# Patient Record
Sex: Female | Born: 1955 | Race: White | Hispanic: No | Marital: Married | Smoking: Never smoker
Health system: Southern US, Community
[De-identification: ages and names within clinical notes are randomized; demographics above are authoritative.]

## PROBLEM LIST (undated history)

## (undated) DIAGNOSIS — E7800 Pure hypercholesterolemia, unspecified: Secondary | ICD-10-CM

## (undated) HISTORY — PX: RETINAL DETACHMENT SURGERY: SHX105

## (undated) HISTORY — PX: OTHER SURGICAL HISTORY: SHX169

## (undated) HISTORY — PX: EYE SURGERY: SHX253

---

## 2004-03-24 ENCOUNTER — Ambulatory Visit: Payer: Self-pay | Admitting: Internal Medicine

## 2004-07-29 ENCOUNTER — Emergency Department: Payer: Self-pay | Admitting: General Practice

## 2004-08-20 ENCOUNTER — Ambulatory Visit: Payer: Self-pay | Admitting: Podiatry

## 2005-03-21 ENCOUNTER — Ambulatory Visit: Payer: Self-pay | Admitting: Internal Medicine

## 2006-03-27 ENCOUNTER — Ambulatory Visit: Payer: Self-pay | Admitting: Internal Medicine

## 2007-04-03 ENCOUNTER — Ambulatory Visit: Payer: Self-pay | Admitting: Internal Medicine

## 2008-04-23 ENCOUNTER — Ambulatory Visit: Payer: Self-pay | Admitting: Internal Medicine

## 2008-06-06 HISTORY — PX: BREAST CYST ASPIRATION: SHX578

## 2009-05-05 ENCOUNTER — Ambulatory Visit: Payer: Self-pay | Admitting: Internal Medicine

## 2009-05-12 ENCOUNTER — Ambulatory Visit: Payer: Self-pay | Admitting: Internal Medicine

## 2009-05-20 ENCOUNTER — Ambulatory Visit: Payer: Self-pay | Admitting: General Surgery

## 2009-06-06 HISTORY — PX: BREAST CYST ASPIRATION: SHX578

## 2009-10-14 ENCOUNTER — Ambulatory Visit: Payer: Self-pay | Admitting: General Surgery

## 2010-05-06 ENCOUNTER — Ambulatory Visit: Payer: Self-pay | Admitting: Internal Medicine

## 2010-05-11 ENCOUNTER — Ambulatory Visit: Payer: Self-pay | Admitting: Gastroenterology

## 2010-05-13 ENCOUNTER — Ambulatory Visit: Payer: Self-pay | Admitting: Internal Medicine

## 2010-11-16 ENCOUNTER — Ambulatory Visit: Payer: Self-pay | Admitting: General Surgery

## 2011-09-13 ENCOUNTER — Ambulatory Visit: Payer: Self-pay | Admitting: Internal Medicine

## 2012-09-18 ENCOUNTER — Ambulatory Visit: Payer: Self-pay

## 2013-11-26 ENCOUNTER — Ambulatory Visit: Payer: Self-pay

## 2013-12-10 ENCOUNTER — Ambulatory Visit: Payer: Self-pay

## 2014-11-18 ENCOUNTER — Other Ambulatory Visit: Payer: Self-pay | Admitting: Internal Medicine

## 2014-11-18 DIAGNOSIS — Z1231 Encounter for screening mammogram for malignant neoplasm of breast: Secondary | ICD-10-CM

## 2014-12-30 ENCOUNTER — Ambulatory Visit
Admission: RE | Admit: 2014-12-30 | Discharge: 2014-12-30 | Disposition: A | Discharge status: Home / Self Care | Payer: 59 | Source: Ambulatory Visit | Attending: Internal Medicine | Admitting: Internal Medicine

## 2014-12-30 DIAGNOSIS — Z1231 Encounter for screening mammogram for malignant neoplasm of breast: Secondary | ICD-10-CM

## 2015-02-13 ENCOUNTER — Encounter: Payer: Self-pay | Admitting: *Deleted

## 2015-02-16 ENCOUNTER — Ambulatory Visit: Payer: 59 | Admitting: Anesthesiology

## 2015-02-16 ENCOUNTER — Ambulatory Visit
Admission: RE | Admit: 2015-02-16 | Discharge: 2015-02-16 | Disposition: A | Discharge status: Home / Self Care | Payer: 59 | Source: Ambulatory Visit | Attending: Gastroenterology | Admitting: Gastroenterology

## 2015-02-16 ENCOUNTER — Encounter
Admission: RE | Disposition: A | Discharge status: Home / Self Care | Payer: Self-pay | Source: Ambulatory Visit | Attending: Gastroenterology

## 2015-02-16 DIAGNOSIS — E780 Pure hypercholesterolemia: Secondary | ICD-10-CM | POA: Diagnosis not present

## 2015-02-16 DIAGNOSIS — Z800 Family history of malignant neoplasm of digestive organs: Secondary | ICD-10-CM | POA: Insufficient documentation

## 2015-02-16 DIAGNOSIS — Z79899 Other long term (current) drug therapy: Secondary | ICD-10-CM | POA: Insufficient documentation

## 2015-02-16 DIAGNOSIS — Z1211 Encounter for screening for malignant neoplasm of colon: Secondary | ICD-10-CM | POA: Insufficient documentation

## 2015-02-16 HISTORY — DX: Pure hypercholesterolemia, unspecified: E78.00

## 2015-02-16 HISTORY — PX: COLONOSCOPY WITH PROPOFOL: SHX5780

## 2015-02-16 SURGERY — COLONOSCOPY WITH PROPOFOL
Anesthesia: General

## 2015-02-16 MED ORDER — PROPOFOL INFUSION 10 MG/ML OPTIME
INTRAVENOUS | Status: DC | PRN
  Administered 2015-02-16: 120 ug/kg/min via INTRAVENOUS

## 2015-02-16 MED ORDER — PROPOFOL 10 MG/ML IV BOLUS
INTRAVENOUS | Status: DC | PRN
  Administered 2015-02-16: 50 mg via INTRAVENOUS

## 2015-02-16 MED ORDER — MIDAZOLAM HCL 5 MG/5ML IJ SOLN
INTRAMUSCULAR | Status: DC | PRN
  Administered 2015-02-16: 1 mg via INTRAVENOUS

## 2015-02-16 MED ORDER — FENTANYL CITRATE (PF) 100 MCG/2ML IJ SOLN
INTRAMUSCULAR | Status: DC | PRN
  Administered 2015-02-16: 50 ug via INTRAVENOUS

## 2015-02-16 MED ORDER — SODIUM CHLORIDE 0.9 % IV SOLN
INTRAVENOUS | Status: DC
  Administered 2015-02-16: 1000 mL via INTRAVENOUS
  Administered 2015-02-16: 10:00:00 via INTRAVENOUS

## 2015-02-16 MED ORDER — SODIUM CHLORIDE 0.9 % IV SOLN: INTRAVENOUS | Status: DC

## 2015-02-16 NOTE — Anesthesia Postprocedure Evaluation (Signed)
  Anesthesia Post-op Note  Patient: Dominique Herrera  Procedure(s) Performed: Procedure(s): COLONOSCOPY WITH PROPOFOL (N/A)  Anesthesia type:General  Patient location: PACU  Post pain: Pain level controlled  Post assessment: Post-op Vital signs reviewed, Patient's Cardiovascular Status Stable, Respiratory Function Stable, Patent Airway and No signs of Nausea or vomiting  Post vital signs: Reviewed and stable  Last Vitals:  Filed Vitals:   02/16/15 1026  BP: 86/56  Pulse: 59  Temp: 36.3 C  Resp: 14    Level of consciousness: awake, alert  and patient cooperative  Complications: No apparent anesthesia complications

## 2015-02-16 NOTE — Transfer of Care (Signed)
Immediate Anesthesia Transfer of Care Note  Patient: Dominique Herrera  Procedure(s) Performed: Procedure(s): COLONOSCOPY WITH PROPOFOL (N/A)  Patient Location: PACU  Anesthesia Type:General  Level of Consciousness: sedated  Airway & Oxygen Therapy: Patient Spontanous Breathing  Post-op Assessment: Report given to RN  Post vital signs: stable  Last Vitals:  Filed Vitals:   02/16/15 0952  BP: 116/66  Pulse: 69  Temp: 36.9 C  Resp: 18    Complications: No apparent anesthesia complications

## 2015-02-16 NOTE — H&P (Signed)
    Primary Care Physician:  Leotis Shames, MD Primary Gastroenterologist:  Dr. Bluford Kaufmann  Pre-Procedure History & Physical: HPI:  Dominique Herrera is a 59 y.o. female is here for an colonoscopy.   Past Medical History  Diagnosis Date  . Elevated cholesterol     Past Surgical History  Procedure Laterality Date  . Breast cyst aspiration Left 2010  . Breast cyst aspiration Right 2011  . Eye surgery    . Retinal detachment surgery    . Excision of foot lesion cyst ganglion    . Cesarean section    . Aspiration of breast cysts  Bilateral     Prior to Admission medications   Medication Sig Start Date End Date Taking? Authorizing Provider  calcium-vitamin D (OSCAL WITH D) 500-200 MG-UNIT per tablet Take 1 tablet by mouth.   Yes Historical Provider, MD  niacin 500 MG tablet Take 500 mg by mouth at bedtime.   Yes Historical Provider, MD    Allergies as of 01/04/2015  . (No Known Allergies)    Family History  Problem Relation Age of Onset  . Colon cancer Maternal Grandfather   . Colon cancer Brother     Social History   Social History  . Marital Status: Married    Spouse Name: N/A  . Number of Children: N/A  . Years of Education: N/A   Occupational History  . Not on file.   Social History Main Topics  . Smoking status: Never Smoker   . Smokeless tobacco: Never Used  . Alcohol Use: Not on file  . Drug Use: Not on file  . Sexual Activity: Not on file   Other Topics Concern  . Not on file   Social History Narrative    Review of Systems: See HPI, otherwise negative ROS  Physical Exam: BP 116/66 mmHg  Pulse 69  Temp(Src) 98.4 F (36.9 C) (Tympanic)  Resp 18  Ht  (1.651 m)  Wt 70.308 kg (155 lb)  BMI 25.79 kg/m2  SpO2 97% General:   Alert,  pleasant and cooperative in NAD Head:  Normocephalic and atraumatic. Neck:  Supple; no masses or thyromegaly. Lungs:  Clear throughout to auscultation.    Heart:  Regular rate and rhythm. Abdomen:  Soft, nontender and  nondistended. Normal bowel sounds, without guarding, and without rebound.   Neurologic:  Alert and  oriented x4;  grossly normal neurologically.  Impression/Plan: [name] is here for an colonoscopy to be performed for screening, family hx of colon cancer and polyps.  Risks, benefits, limitations, and alternatives regarding  Colonoscopy have been reviewed with the patient.  Questions have been answered.  All parties agreeable.   Jennamarie Goings, Ezzard Standing, MD  02/16/2015, 9:57 AM

## 2015-02-16 NOTE — Anesthesia Preprocedure Evaluation (Signed)
Anesthesia Evaluation  Patient identified by MRN, date of birth, ID band Patient awake    Reviewed: Allergy & Precautions, NPO status , Patient's Chart, lab work & pertinent test results  History of Anesthesia Complications Negative for: history of anesthetic complications  Airway Mallampati: II   Neck ROM: Full    Dental  (+) Teeth Intact   Pulmonary neg pulmonary ROS,           Cardiovascular negative cardio ROS       Neuro/Psych negative neurological ROS     GI/Hepatic negative GI ROS, Neg liver ROS,   Endo/Other  negative endocrine ROS  Renal/GU negative Renal ROS     Musculoskeletal   Abdominal   Peds  Hematology negative hematology ROS (+)   Anesthesia Other Findings   Reproductive/Obstetrics                             Anesthesia Physical Anesthesia Plan  ASA: I  Anesthesia Plan: General   Post-op Pain Management:    Induction: Intravenous  Airway Management Planned: Nasal Cannula  Additional Equipment:   Intra-op Plan:   Post-operative Plan:   Informed Consent: I have reviewed the patients History and Physical, chart, labs and discussed the procedure including the risks, benefits and alternatives for the proposed anesthesia with the patient or authorized representative who has indicated his/her understanding and acceptance.     Plan Discussed with:   Anesthesia Plan Comments:         Anesthesia Quick Evaluation

## 2015-02-16 NOTE — Op Note (Signed)
[facility] Gastroenterology Patient Name: Dominique Herrera Procedure Date: 02/16/2015 10:07 AM MRN: [id number] Account #: [id number] Date of Birth: 1955/10/19 Admit Type: Outpatient Age: 59 Room: [facility] ENDO ROOM 4 Gender: Female Note Status: Finalized Procedure:         Colonoscopy Indications:       Colon cancer screening in patient at increased risk:                     Family history of colon polyps, Screening in patient at                     increased risk: Family history of 1st-degree relative with                     colorectal cancer before age 1 years Providers:         Ezzard Standing. Bluford Kaufmann, MD Referring MD:      Leotis Shames (Referring MD) Medicines:         Monitored Anesthesia Care Complications:     No immediate complications. Procedure:         Pre-Anesthesia Assessment:                    - Prior to the procedure, a History and Physical was                     performed, and patient medications, allergies and                     sensitivities were reviewed. The patient's tolerance of                     previous anesthesia was reviewed.                    - The risks and benefits of the procedure and the sedation                     options and risks were discussed with the patient. All                     questions were answered and informed consent was obtained.                    - After reviewing the risks and benefits, the patient was                     deemed in satisfactory condition to undergo the procedure.                    After obtaining informed consent, the colonoscope was                     passed under direct vision. Throughout the procedure, the                     patient's blood pressure, pulse, and oxygen saturations                     were monitored continuously. The Colonoscope was                     introduced through the anus and advanced to the the cecum,  identified by appendiceal orifice and ileocecal valve. The                    colonoscopy was performed without difficulty. The patient                     tolerated the procedure well. The quality of the bowel                     preparation was good. Findings:      The colon (entire examined portion) appeared normal. Impression:        - The entire examined colon is normal.                    - No specimens collected. Recommendation:    - Discharge patient to home.                    - Repeat colonoscopy in 5 years for surveillance.                    - The findings and recommendations were discussed with the                     patient. Procedure Code(s): --- Professional ---                    [phone], Colonoscopy, flexible; diagnostic, including                     collection of specimen(s) by brushing or washing, when                     performed (separate procedure) Diagnosis Code(s): --- Professional ---                    Z83.71, Family history of colonic polyps                    Z80.0, Family history of malignant neoplasm of digestive                     organs CPT copyright 2014 [employer]. All rights reserved. The codes documented in this report are preliminary and upon coder review may  be revised to meet current compliance requirements. Wallace Cullens, MD 02/16/2015 10:20:59 AM This report has been signed electronically. Number of Addenda: 0 Note Initiated On: 02/16/2015 10:07 AM Scope Withdrawal Time: 0 hours 3 minutes 5 seconds  Total Procedure Duration: 0 hours 7 minutes 15 seconds       [facility]

## 2015-02-16 NOTE — Transfer of Care (Signed)
Immediate Anesthesia Transfer of Care Note  Patient: Dominique Herrera  Procedure(s) Performed: Procedure(s): COLONOSCOPY WITH PROPOFOL (N/A)  Patient Location: PACU  Anesthesia Type:General  Level of Consciousness: sedated  Airway & Oxygen Therapy: Patient Spontanous Breathing  Post-op Assessment: Report given to RN  Post vital signs: stable  Last Vitals:  Filed Vitals:   02/16/15 1026  BP: 86/56  Pulse: 59  Temp: 36.3 C  Resp: 14    Complications: No apparent anesthesia complications

## 2015-02-17 ENCOUNTER — Encounter: Payer: Self-pay | Admitting: Gastroenterology

## 2015-10-07 ENCOUNTER — Other Ambulatory Visit: Payer: Self-pay | Admitting: Internal Medicine

## 2015-10-07 DIAGNOSIS — Z1231 Encounter for screening mammogram for malignant neoplasm of breast: Secondary | ICD-10-CM

## 2015-10-16 ENCOUNTER — Ambulatory Visit: Payer: 59

## 2016-01-05 ENCOUNTER — Ambulatory Visit
Admission: RE | Admit: 2016-01-05 | Discharge: 2016-01-05 | Disposition: A | Discharge status: Home / Self Care | Payer: 59 | Source: Ambulatory Visit | Attending: Internal Medicine | Admitting: Internal Medicine

## 2016-01-05 ENCOUNTER — Other Ambulatory Visit: Payer: Self-pay | Admitting: Internal Medicine

## 2016-01-05 DIAGNOSIS — Z1231 Encounter for screening mammogram for malignant neoplasm of breast: Secondary | ICD-10-CM | POA: Insufficient documentation

## 2016-11-15 ENCOUNTER — Other Ambulatory Visit: Payer: Self-pay | Admitting: Internal Medicine

## 2016-11-15 DIAGNOSIS — Z1239 Encounter for other screening for malignant neoplasm of breast: Secondary | ICD-10-CM

## 2017-03-20 ENCOUNTER — Ambulatory Visit
Admission: RE | Admit: 2017-03-20 | Discharge: 2017-03-20 | Disposition: A | Discharge status: Home / Self Care | Payer: 59 | Source: Ambulatory Visit | Attending: Internal Medicine | Admitting: Internal Medicine

## 2017-03-20 DIAGNOSIS — Z1231 Encounter for screening mammogram for malignant neoplasm of breast: Secondary | ICD-10-CM | POA: Diagnosis not present

## 2017-03-20 DIAGNOSIS — Z1239 Encounter for other screening for malignant neoplasm of breast: Secondary | ICD-10-CM

## 2017-03-22 ENCOUNTER — Other Ambulatory Visit: Payer: Self-pay | Admitting: Internal Medicine

## 2017-03-22 DIAGNOSIS — R928 Other abnormal and inconclusive findings on diagnostic imaging of breast: Secondary | ICD-10-CM

## 2017-03-30 ENCOUNTER — Ambulatory Visit
Admission: RE | Admit: 2017-03-30 | Discharge: 2017-03-30 | Disposition: A | Discharge status: Home / Self Care | Payer: 59 | Source: Ambulatory Visit | Attending: Internal Medicine | Admitting: Internal Medicine

## 2017-03-30 DIAGNOSIS — R928 Other abnormal and inconclusive findings on diagnostic imaging of breast: Secondary | ICD-10-CM

## 2017-03-30 DIAGNOSIS — N6489 Other specified disorders of breast: Secondary | ICD-10-CM | POA: Diagnosis present

## 2017-09-07 ENCOUNTER — Other Ambulatory Visit: Payer: Self-pay | Admitting: Internal Medicine

## 2017-09-07 DIAGNOSIS — R928 Other abnormal and inconclusive findings on diagnostic imaging of breast: Secondary | ICD-10-CM

## 2017-10-04 ENCOUNTER — Other Ambulatory Visit: Payer: Self-pay | Admitting: Internal Medicine

## 2017-10-04 DIAGNOSIS — R928 Other abnormal and inconclusive findings on diagnostic imaging of breast: Secondary | ICD-10-CM

## 2017-10-06 ENCOUNTER — Ambulatory Visit
Admission: RE | Admit: 2017-10-06 | Discharge: 2017-10-06 | Disposition: A | Discharge status: Home / Self Care | Payer: 59 | Source: Ambulatory Visit | Attending: Internal Medicine | Admitting: Internal Medicine

## 2017-10-06 ENCOUNTER — Ambulatory Visit: Payer: 59

## 2017-10-06 DIAGNOSIS — R928 Other abnormal and inconclusive findings on diagnostic imaging of breast: Secondary | ICD-10-CM

## 2018-06-15 ENCOUNTER — Other Ambulatory Visit: Payer: Self-pay | Admitting: Internal Medicine

## 2018-06-15 DIAGNOSIS — R928 Other abnormal and inconclusive findings on diagnostic imaging of breast: Secondary | ICD-10-CM

## 2018-07-03 ENCOUNTER — Other Ambulatory Visit: Payer: Self-pay | Admitting: Internal Medicine

## 2018-07-03 ENCOUNTER — Ambulatory Visit
Admission: RE | Admit: 2018-07-03 | Discharge: 2018-07-03 | Disposition: A | Discharge status: Home / Self Care | Payer: Medicare Other | Source: Ambulatory Visit | Attending: Internal Medicine | Admitting: Internal Medicine

## 2018-07-03 DIAGNOSIS — R928 Other abnormal and inconclusive findings on diagnostic imaging of breast: Secondary | ICD-10-CM

## 2019-01-21 ENCOUNTER — Other Ambulatory Visit: Payer: Self-pay | Admitting: Internal Medicine

## 2019-01-21 DIAGNOSIS — R928 Other abnormal and inconclusive findings on diagnostic imaging of breast: Secondary | ICD-10-CM

## 2019-03-08 ENCOUNTER — Ambulatory Visit
Admission: RE | Admit: 2019-03-08 | Discharge: 2019-03-08 | Disposition: A | Discharge status: Home / Self Care | Payer: Medicare Other | Source: Ambulatory Visit | Attending: Internal Medicine | Admitting: Internal Medicine

## 2019-03-08 DIAGNOSIS — R928 Other abnormal and inconclusive findings on diagnostic imaging of breast: Secondary | ICD-10-CM

## 2020-04-01 ENCOUNTER — Other Ambulatory Visit: Payer: Self-pay | Admitting: Internal Medicine

## 2020-04-01 DIAGNOSIS — Z1231 Encounter for screening mammogram for malignant neoplasm of breast: Secondary | ICD-10-CM

## 2020-05-08 ENCOUNTER — Ambulatory Visit
Admission: RE | Admit: 2020-05-08 | Discharge: 2020-05-08 | Disposition: A | Discharge status: Home / Self Care | Payer: Medicare Other | Source: Ambulatory Visit | Attending: Internal Medicine | Admitting: Internal Medicine

## 2020-05-08 ENCOUNTER — Other Ambulatory Visit: Payer: Self-pay

## 2020-05-08 DIAGNOSIS — Z1231 Encounter for screening mammogram for malignant neoplasm of breast: Secondary | ICD-10-CM | POA: Diagnosis not present

## 2020-05-13 ENCOUNTER — Other Ambulatory Visit: Payer: Self-pay | Admitting: Internal Medicine

## 2020-05-13 DIAGNOSIS — R928 Other abnormal and inconclusive findings on diagnostic imaging of breast: Secondary | ICD-10-CM

## 2020-05-26 ENCOUNTER — Other Ambulatory Visit: Payer: Self-pay

## 2020-05-26 ENCOUNTER — Ambulatory Visit
Admission: RE | Admit: 2020-05-26 | Discharge: 2020-05-26 | Disposition: A | Discharge status: Home / Self Care | Payer: Medicare Other | Source: Ambulatory Visit | Attending: Internal Medicine | Admitting: Internal Medicine

## 2020-05-26 DIAGNOSIS — R928 Other abnormal and inconclusive findings on diagnostic imaging of breast: Secondary | ICD-10-CM

## 2021-07-12 ENCOUNTER — Other Ambulatory Visit: Payer: Self-pay | Admitting: Internal Medicine

## 2021-07-12 DIAGNOSIS — Z1231 Encounter for screening mammogram for malignant neoplasm of breast: Secondary | ICD-10-CM

## 2021-07-20 ENCOUNTER — Other Ambulatory Visit: Payer: Self-pay

## 2021-07-20 ENCOUNTER — Ambulatory Visit
Admission: RE | Admit: 2021-07-20 | Discharge: 2021-07-20 | Disposition: A | Discharge status: Home / Self Care | Payer: Medicare Other | Source: Ambulatory Visit | Attending: Internal Medicine | Admitting: Internal Medicine

## 2021-07-20 DIAGNOSIS — Z1231 Encounter for screening mammogram for malignant neoplasm of breast: Secondary | ICD-10-CM | POA: Diagnosis present

## 2021-07-23 ENCOUNTER — Other Ambulatory Visit: Payer: Self-pay | Admitting: Internal Medicine

## 2021-07-23 DIAGNOSIS — R928 Other abnormal and inconclusive findings on diagnostic imaging of breast: Secondary | ICD-10-CM

## 2021-08-04 ENCOUNTER — Ambulatory Visit
Admission: RE | Admit: 2021-08-04 | Discharge: 2021-08-04 | Disposition: A | Discharge status: Home / Self Care | Payer: Medicare Other | Source: Ambulatory Visit | Attending: Internal Medicine | Admitting: Internal Medicine

## 2021-08-04 ENCOUNTER — Other Ambulatory Visit: Payer: Self-pay

## 2021-08-04 DIAGNOSIS — R928 Other abnormal and inconclusive findings on diagnostic imaging of breast: Secondary | ICD-10-CM

## 2021-08-06 ENCOUNTER — Other Ambulatory Visit: Payer: Self-pay | Admitting: Internal Medicine

## 2021-08-06 DIAGNOSIS — N6489 Other specified disorders of breast: Secondary | ICD-10-CM

## 2021-08-06 DIAGNOSIS — R928 Other abnormal and inconclusive findings on diagnostic imaging of breast: Secondary | ICD-10-CM

## 2021-08-18 ENCOUNTER — Ambulatory Visit
Admission: RE | Admit: 2021-08-18 | Discharge: 2021-08-18 | Disposition: A | Discharge status: Home / Self Care | Payer: Medicare Other | Source: Ambulatory Visit | Attending: Internal Medicine | Admitting: Internal Medicine

## 2021-08-18 ENCOUNTER — Other Ambulatory Visit: Payer: Self-pay

## 2021-08-18 DIAGNOSIS — N6489 Other specified disorders of breast: Secondary | ICD-10-CM | POA: Insufficient documentation

## 2021-08-18 DIAGNOSIS — R928 Other abnormal and inconclusive findings on diagnostic imaging of breast: Secondary | ICD-10-CM | POA: Insufficient documentation

## 2021-08-18 DIAGNOSIS — Z1231 Encounter for screening mammogram for malignant neoplasm of breast: Secondary | ICD-10-CM | POA: Insufficient documentation

## 2021-08-19 LAB — SURGICAL PATHOLOGY

## 2021-12-29 ENCOUNTER — Other Ambulatory Visit: Payer: Self-pay | Admitting: Internal Medicine

## 2021-12-29 DIAGNOSIS — N6489 Other specified disorders of breast: Secondary | ICD-10-CM

## 2022-02-23 ENCOUNTER — Ambulatory Visit
Admission: RE | Admit: 2022-02-23 | Discharge: 2022-02-23 | Disposition: A | Discharge status: Home / Self Care | Payer: Medicare Other | Source: Ambulatory Visit | Attending: Internal Medicine | Admitting: Internal Medicine

## 2022-02-23 DIAGNOSIS — N6489 Other specified disorders of breast: Secondary | ICD-10-CM

## 2023-03-11 IMAGING — US US BREAST*R* LIMITED INC AXILLA
2 series · 9 of 9 positions shown · non-contrast
Comparison: Prior films

CLINICAL DATA: Callback from screening mammogram for possible
asymmetries bilateral breasts.

EXAM:
DIGITAL DIAGNOSTIC BILATERAL MAMMOGRAM WITH TOMOSYNTHESIS AND CAD;
ULTRASOUND RIGHT BREAST LIMITED
TECHNIQUE: Bilateral digital diagnostic mammography and breast tomosynthesis
was performed. The images were evaluated with computer-aided
detection.; Targeted ultrasound examination of the right breast was
performed

[Series 1: us breast*right* limited inc axilla · 0.06mm/px · 7 of 7 slices shown (1 of 2)]
[im 1/7]
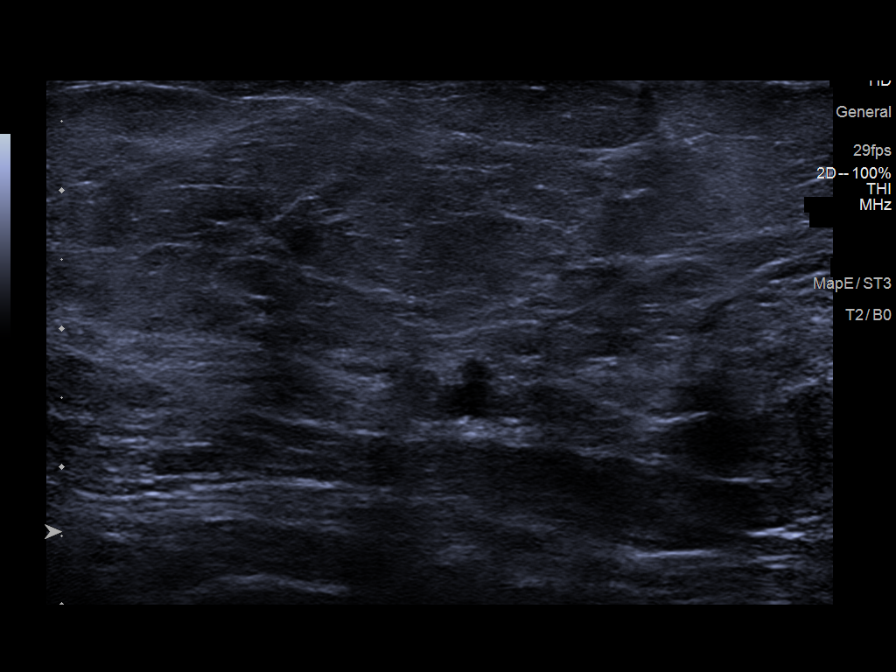
[im 2/7]
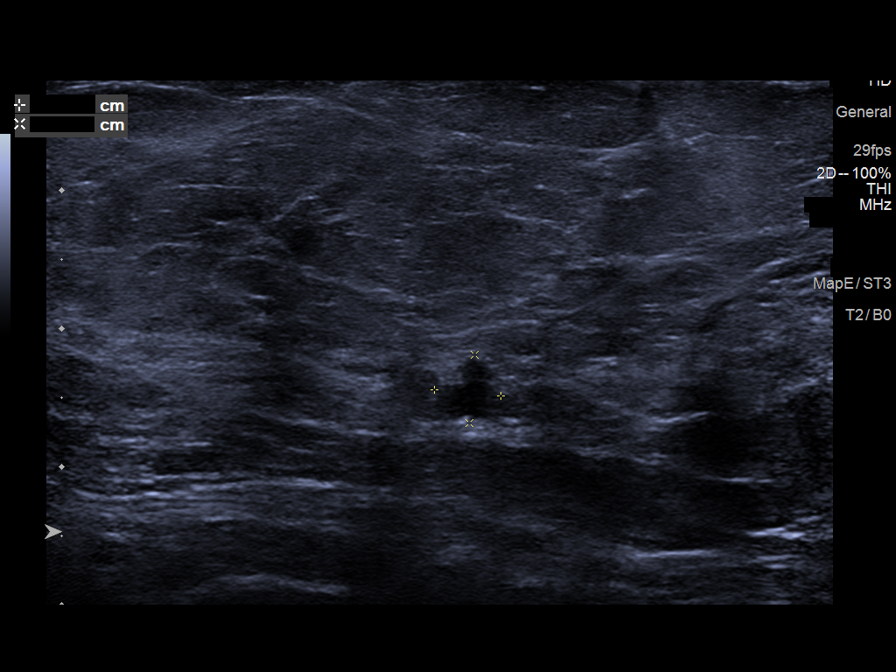
[im 3/7]
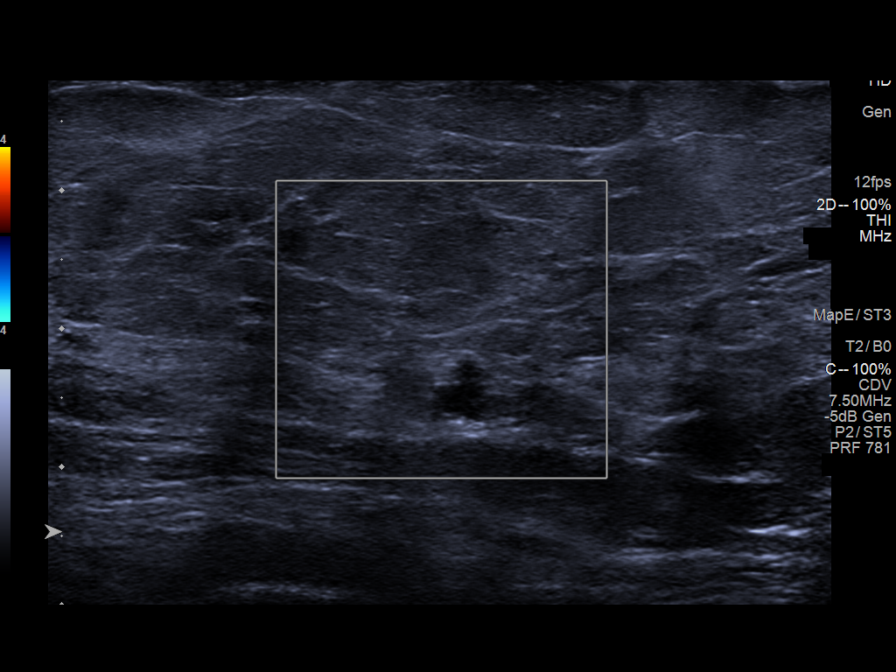
[im 4/7]
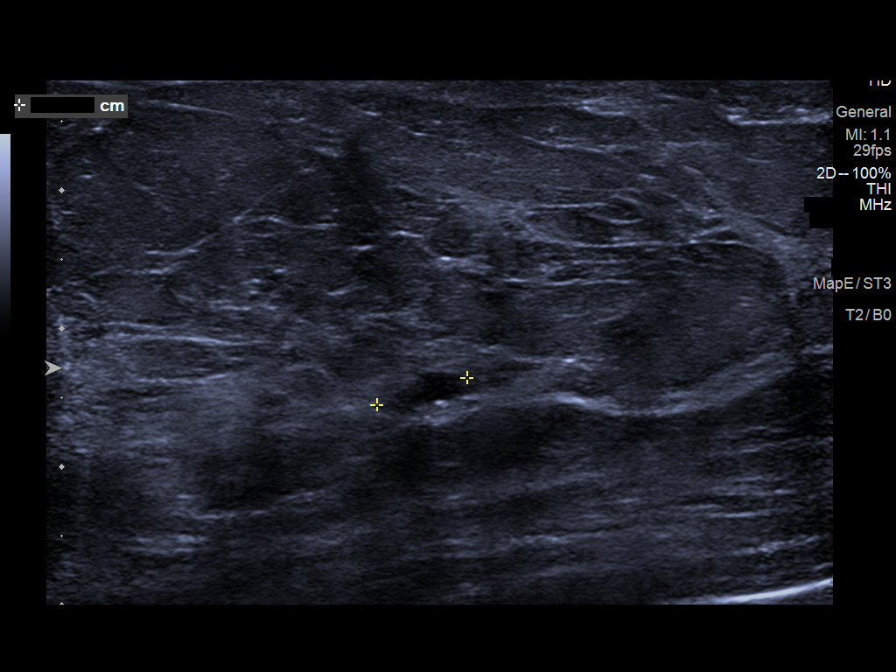
[im 5/7]
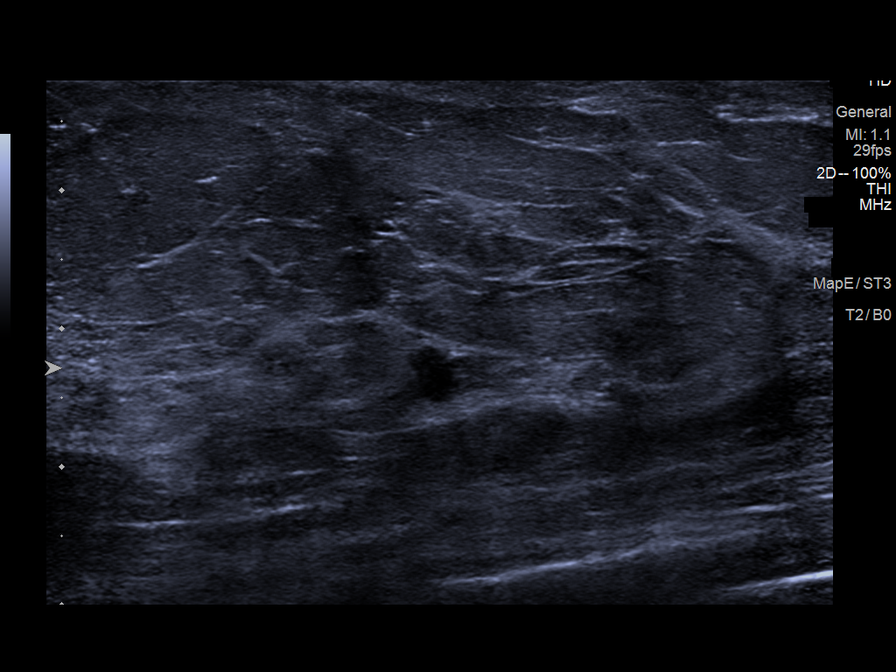
[im 6/7]
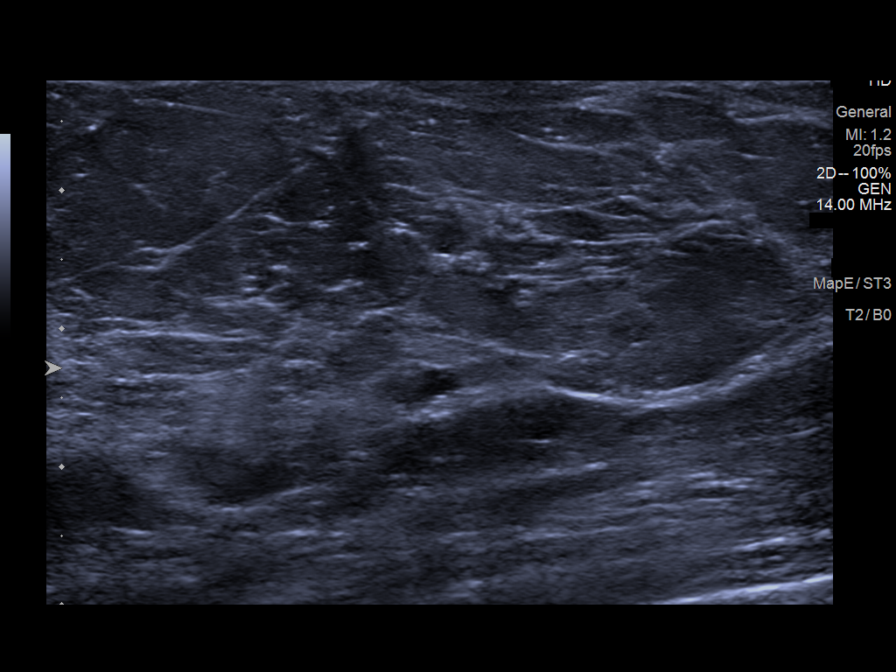
[im 7/7]
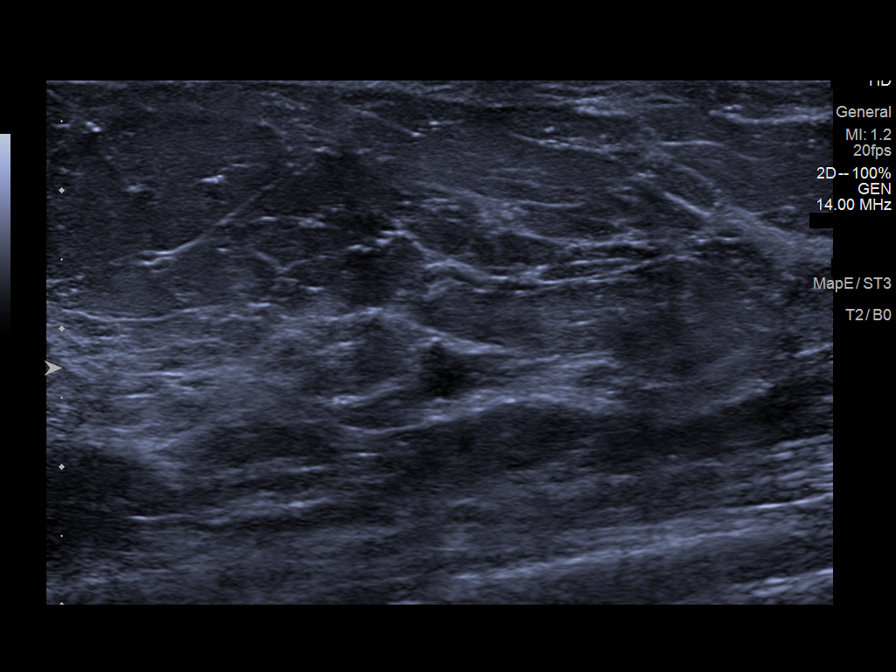

[Series 2: us breast*right* limited inc axilla · 0.09mm/px · 2 of 2 slices shown (2 of 2)]
[im 1/2]
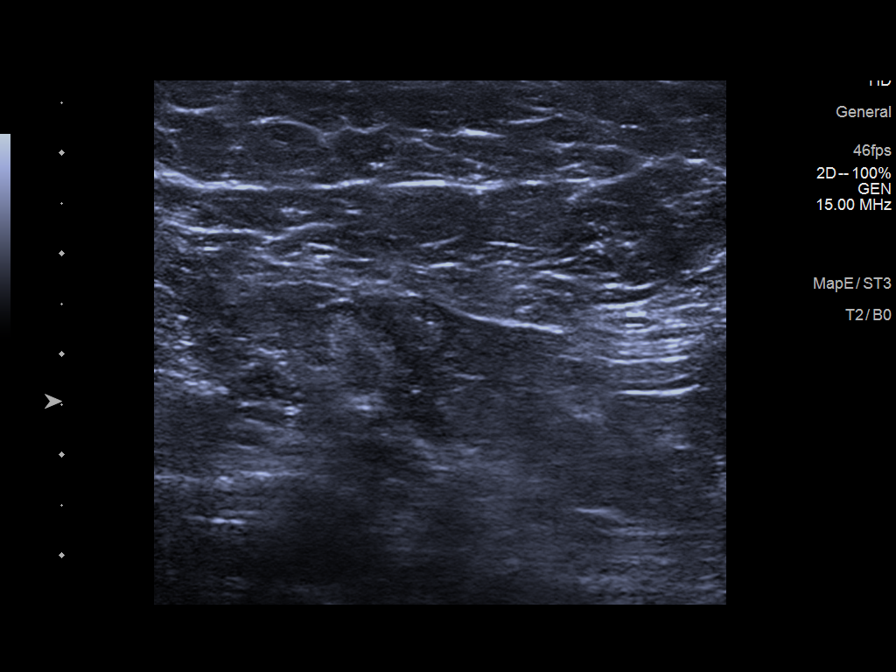
[im 2/2]
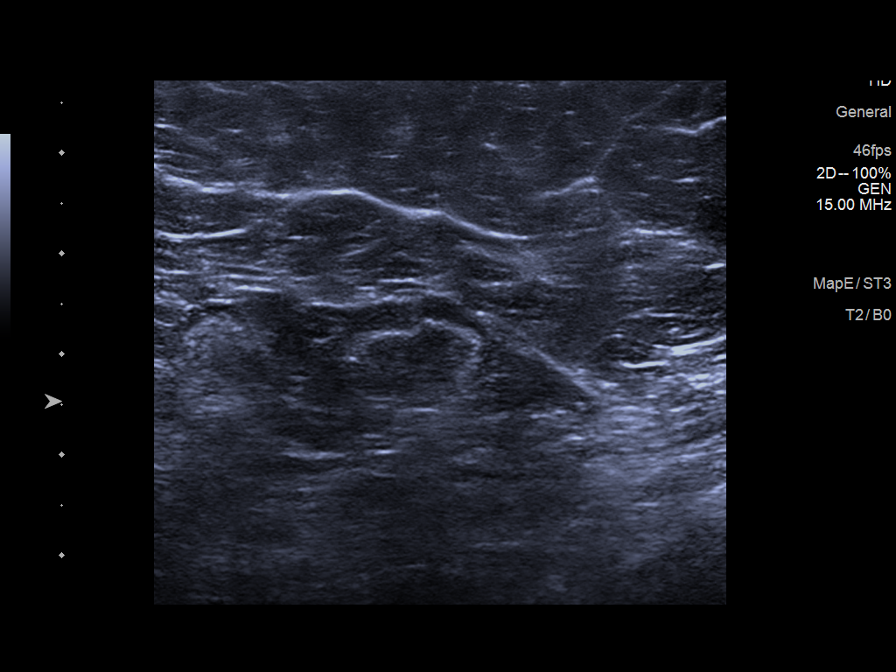

[9 of 9 positions shown; findings below may reference images not displayed]

ACR Breast Density Category b: There are scattered areas of
fibroglandular density.
FINDINGS: Spot compression cc and MLO views of right breast, spot compression
left MLO view, lateral views of bilateral breasts are submitted. The
previously questioned asymmetry in the posterior slight lateral
right breast with associated single calcification is persistent. The
previously questioned asymmetry further laterally in the posterior
left breast is not persistent. The previously questioned asymmetry
in the lower left breast is less well seen on additional views.

Targeted ultrasound is performed, showing lobulated hypoechoic
lesion at right breast 7:30 o'clock 3 cm from nipple measuring 0.5 x
0.5 x 0.7 cm. This may correlate to the mammographic finding.
Ultrasound of the right axilla is negative.
IMPRESSION: Mildly suspicious findings.

RECOMMENDATION:
Recommend stereotactic core biopsy of right breast asymmetry.

I have discussed the findings and recommendations with the patient.
If applicable, a reminder letter will be sent to the patient
regarding the next appointment.

BI-RADS CATEGORY  4: Suspicious.

## 2023-03-11 IMAGING — MG DIGITAL DIAGNOSTIC BILAT W/ TOMO W/ CAD
8 of 14 series · 8 of 40 positions shown · non-contrast
Comparison: Prior films

CLINICAL DATA: Callback from screening mammogram for possible
asymmetries bilateral breasts.

EXAM:
DIGITAL DIAGNOSTIC BILATERAL MAMMOGRAM WITH TOMOSYNTHESIS AND CAD;
ULTRASOUND RIGHT BREAST LIMITED
TECHNIQUE: Bilateral digital diagnostic mammography and breast tomosynthesis
was performed. The images were evaluated with computer-aided
detection.; Targeted ultrasound examination of the right breast was
performed

[L MLO synth-2D (1 of 2)]
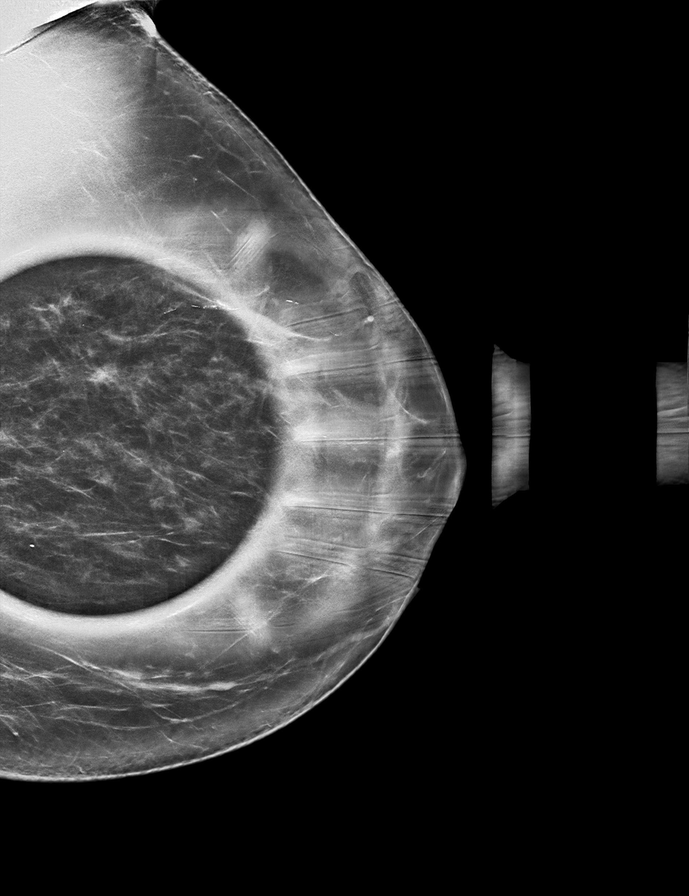

[R CC synth-2D (1 of 2)]
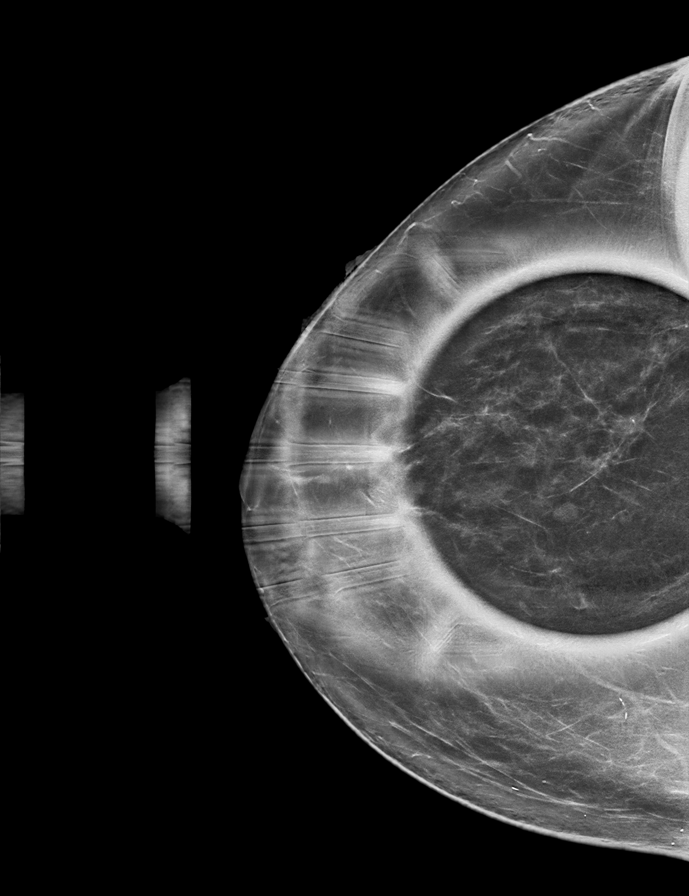

[R CC synth-2D (2 of 2)]
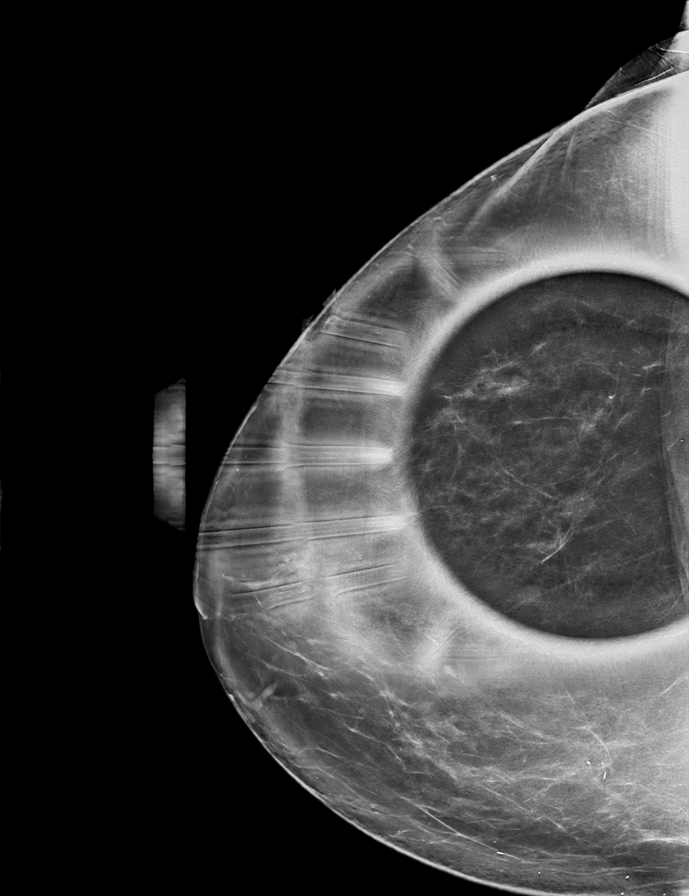

[L ML synth-2D]
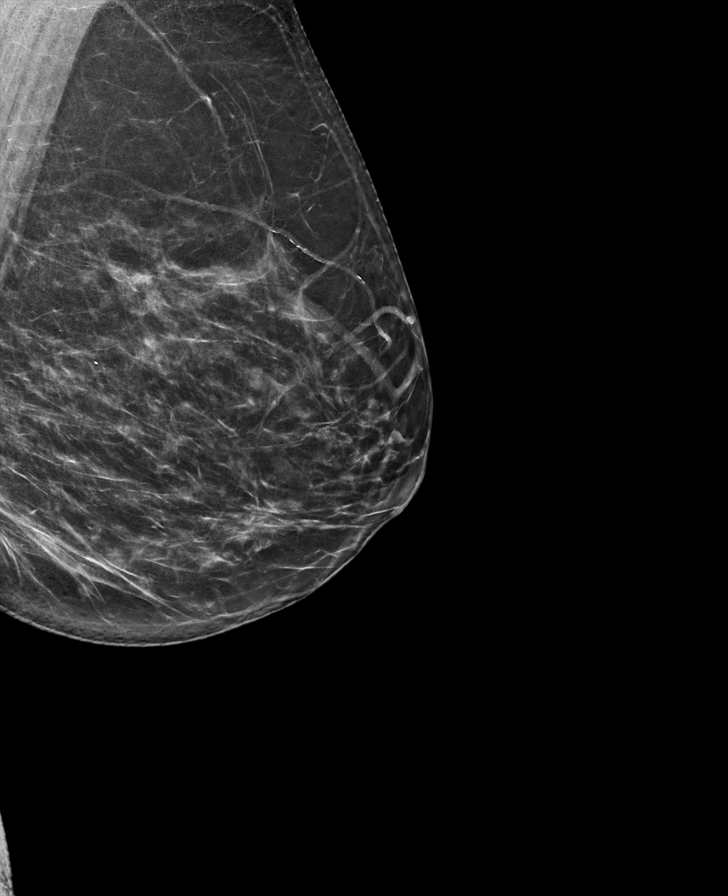

[R MLO synth-2D]
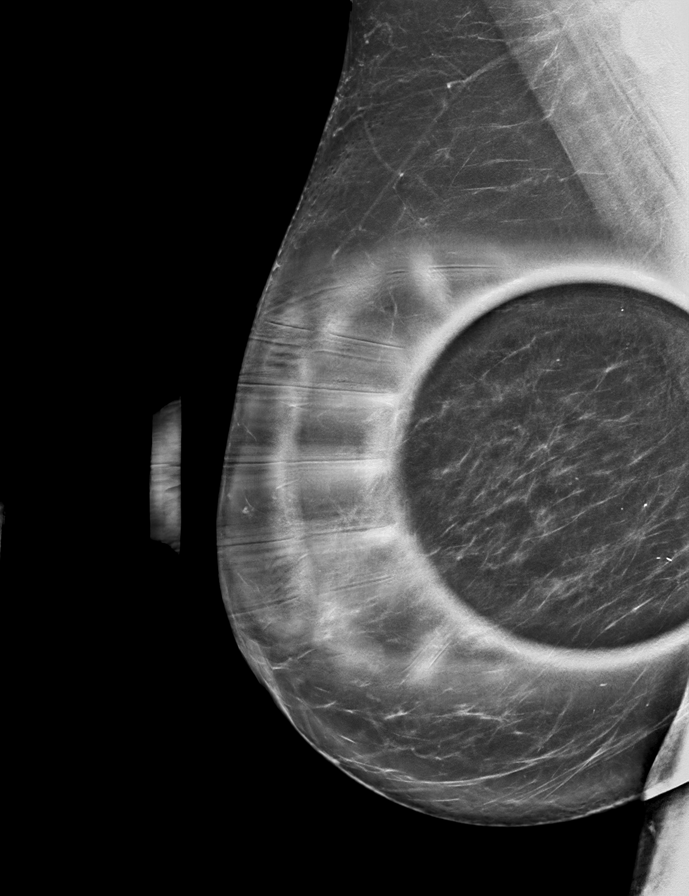

[R ML synth-2D]
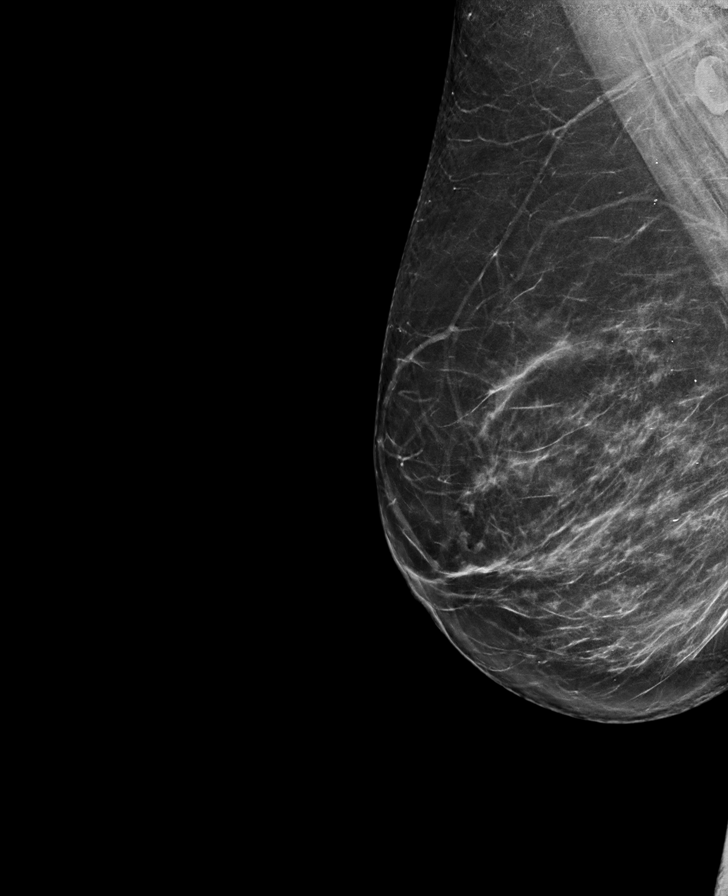

[L MLO synth-2D (2 of 2)]
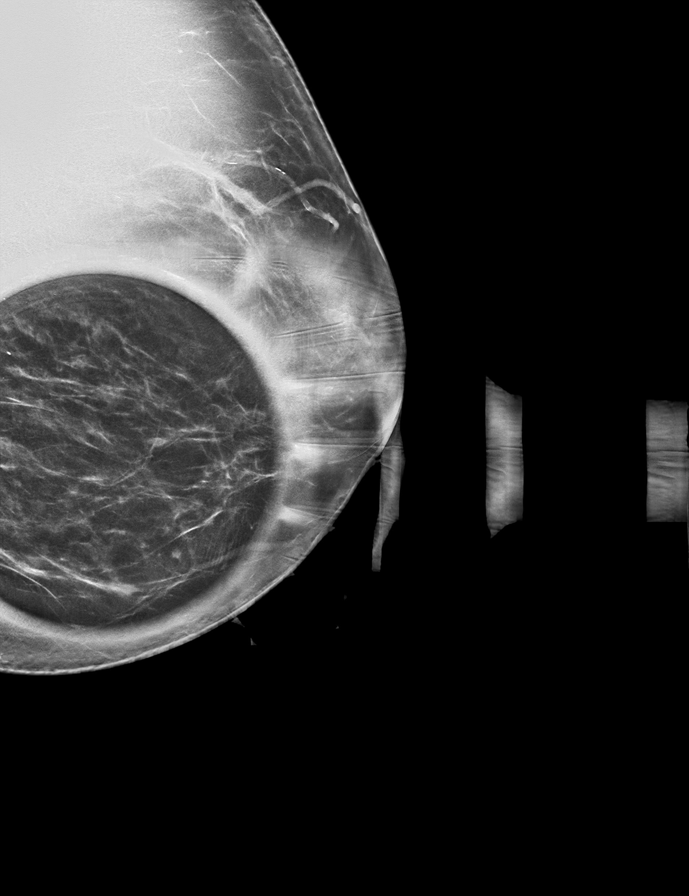

[R CC tomo · tomo slice 43/86.0]
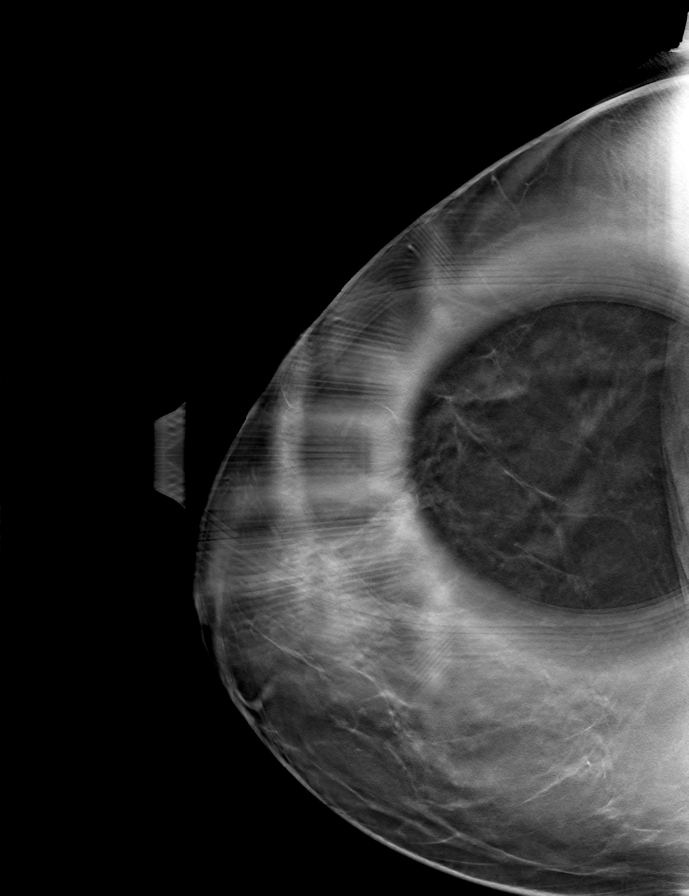

[8 of 40 positions shown; findings below may reference images not displayed]

ACR Breast Density Category b: There are scattered areas of
fibroglandular density.
FINDINGS: Spot compression cc and MLO views of right breast, spot compression
left MLO view, lateral views of bilateral breasts are submitted. The
previously questioned asymmetry in the posterior slight lateral
right breast with associated single calcification is persistent. The
previously questioned asymmetry further laterally in the posterior
left breast is not persistent. The previously questioned asymmetry
in the lower left breast is less well seen on additional views.

Targeted ultrasound is performed, showing lobulated hypoechoic
lesion at right breast 7:30 o'clock 3 cm from nipple measuring 0.5 x
0.5 x 0.7 cm. This may correlate to the mammographic finding.
Ultrasound of the right axilla is negative.
IMPRESSION: Mildly suspicious findings.

RECOMMENDATION:
Recommend stereotactic core biopsy of right breast asymmetry.

I have discussed the findings and recommendations with the patient.
If applicable, a reminder letter will be sent to the patient
regarding the next appointment.

BI-RADS CATEGORY  4: Suspicious.
